# Patient Record
Sex: Female | Born: 1940 | Race: White | Hispanic: No | Marital: Married | Smoking: Former smoker
Health system: Southern US, Community
[De-identification: ages and names within clinical notes are randomized; demographics above are authoritative.]

## PROBLEM LIST (undated history)

## (undated) DIAGNOSIS — M810 Age-related osteoporosis without current pathological fracture: Secondary | ICD-10-CM

## (undated) DIAGNOSIS — G629 Polyneuropathy, unspecified: Secondary | ICD-10-CM

## (undated) DIAGNOSIS — I2510 Atherosclerotic heart disease of native coronary artery without angina pectoris: Secondary | ICD-10-CM

## (undated) DIAGNOSIS — I729 Aneurysm of unspecified site: Secondary | ICD-10-CM

## (undated) DIAGNOSIS — I10 Essential (primary) hypertension: Secondary | ICD-10-CM

## (undated) DIAGNOSIS — K219 Gastro-esophageal reflux disease without esophagitis: Secondary | ICD-10-CM

## (undated) DIAGNOSIS — I351 Nonrheumatic aortic (valve) insufficiency: Secondary | ICD-10-CM

## (undated) DIAGNOSIS — I4891 Unspecified atrial fibrillation: Secondary | ICD-10-CM

## (undated) DIAGNOSIS — I8390 Asymptomatic varicose veins of unspecified lower extremity: Secondary | ICD-10-CM

## (undated) DIAGNOSIS — J449 Chronic obstructive pulmonary disease, unspecified: Secondary | ICD-10-CM

## (undated) DIAGNOSIS — H409 Unspecified glaucoma: Secondary | ICD-10-CM

## (undated) DIAGNOSIS — I700 Atherosclerosis of aorta: Secondary | ICD-10-CM

## (undated) DIAGNOSIS — E785 Hyperlipidemia, unspecified: Secondary | ICD-10-CM

## (undated) HISTORY — DX: Polyneuropathy, unspecified: G62.9

## (undated) HISTORY — PX: CATARACT EXTRACTION: SUR2

## (undated) HISTORY — PX: CARDIAC CATHETERIZATION: SHX172

## (undated) HISTORY — DX: Atherosclerosis of aorta: I70.0

## (undated) HISTORY — PX: FOOT SURGERY: SHX648

## (undated) HISTORY — PX: WISDOM TOOTH EXTRACTION: SHX21

## (undated) HISTORY — DX: Atherosclerotic heart disease of native coronary artery without angina pectoris: I25.10

## (undated) HISTORY — DX: Nonrheumatic aortic (valve) insufficiency: I35.1

## (undated) HISTORY — PX: AORTIC VALVE SURGERY: SHX549

## (undated) HISTORY — DX: Gastro-esophageal reflux disease without esophagitis: K21.9

## (undated) HISTORY — PX: BUNIONECTOMY: SHX129

## (undated) HISTORY — PX: TONSILLECTOMY: SUR1361

## (undated) HISTORY — DX: Asymptomatic varicose veins of unspecified lower extremity: I83.90

---

## 2015-10-10 HISTORY — PX: FEMUR FRACTURE SURGERY: SHX633

## 2019-08-13 ENCOUNTER — Emergency Department (HOSPITAL_BASED_OUTPATIENT_CLINIC_OR_DEPARTMENT_OTHER)
Admission: EM | Admit: 2019-08-13 | Discharge: 2019-08-13 | Disposition: A | Discharge status: Home / Self Care | Payer: Medicare HMO | Attending: Emergency Medicine | Admitting: Emergency Medicine

## 2019-08-13 ENCOUNTER — Other Ambulatory Visit: Payer: Self-pay

## 2019-08-13 ENCOUNTER — Encounter (HOSPITAL_BASED_OUTPATIENT_CLINIC_OR_DEPARTMENT_OTHER): Payer: Self-pay

## 2019-08-13 DIAGNOSIS — J449 Chronic obstructive pulmonary disease, unspecified: Secondary | ICD-10-CM | POA: Insufficient documentation

## 2019-08-13 DIAGNOSIS — I4891 Unspecified atrial fibrillation: Secondary | ICD-10-CM | POA: Insufficient documentation

## 2019-08-13 DIAGNOSIS — I10 Essential (primary) hypertension: Secondary | ICD-10-CM | POA: Diagnosis not present

## 2019-08-13 DIAGNOSIS — I259 Chronic ischemic heart disease, unspecified: Secondary | ICD-10-CM | POA: Insufficient documentation

## 2019-08-13 DIAGNOSIS — R1310 Dysphagia, unspecified: Secondary | ICD-10-CM | POA: Diagnosis not present

## 2019-08-13 HISTORY — DX: Chronic obstructive pulmonary disease, unspecified: J44.9

## 2019-08-13 HISTORY — DX: Atherosclerotic heart disease of native coronary artery without angina pectoris: I25.10

## 2019-08-13 HISTORY — DX: Essential (primary) hypertension: I10

## 2019-08-13 HISTORY — DX: Unspecified glaucoma: H40.9

## 2019-08-13 HISTORY — DX: Hyperlipidemia, unspecified: E78.5

## 2019-08-13 HISTORY — DX: Age-related osteoporosis without current pathological fracture: M81.0

## 2019-08-13 HISTORY — DX: Aneurysm of unspecified site: I72.9

## 2019-08-13 HISTORY — DX: Unspecified atrial fibrillation: I48.91

## 2019-08-13 NOTE — ED Provider Notes (Signed)
[facility] EMERGENCY DEPARTMENT Provider Note   CSN: [id number] Arrival date & time: 08/13/19  2114     History   Chief Complaint Chief Complaint  Patient presents with  . Dysphagia    HPI Victoria Holloway is a 78 y.o. female.     78 yo F with a chief complaint of difficulty swallowing.  Patient was eating a hamburger today and realized that she was having some trouble getting it down.  She was able to swallow it and has been able to drink afterwards.  No longer feels like she is having trouble swallowing.  She had trouble a few days ago.  She thinks is due to a new medicine she is taking for bladder spasms.  She plans on stopping taking this medicine following her doctor in the morning.  She denies any double vision denies any difficulty with speech denies unilateral numbness or weakness denies headache or neck pain.  The history is provided by the patient.  Illness Severity:  Moderate Onset quality:  Gradual Duration:  2 days Timing:  Intermittent Progression:  Resolved Chronicity:  New Associated symptoms: no chest pain, no congestion, no fever, no headaches, no myalgias, no nausea, no rhinorrhea, no shortness of breath, no vomiting and no wheezing     Past Medical History:  Diagnosis Date  . Aneurysm (HCC)   . Atrial fibrillation (HCC)   . COPD (chronic obstructive pulmonary disease) (HCC)   . Coronary artery disease    cath 2011  . Glaucoma   . Hyperlipidemia cad  . Hypertension   . Osteoporosis     There are no active problems to display for this patient.   Past Surgical History:  Procedure Laterality Date  . FEMUR FRACTURE SURGERY  2017     OB History   No obstetric history on file.      Home Medications    Prior to Admission medications   Not on File    Family History No family history on file.  Social History Social History   Tobacco Use  . Smoking status: Never Smoker  . Smokeless tobacco: Never Used  Substance Use Topics   . Alcohol use: Yes  . Drug use: Never     Allergies   Patient has no allergy information on record.   Review of Systems Review of Systems  Constitutional: Negative for chills and fever.  HENT: Negative for congestion and rhinorrhea.   Eyes: Negative for redness and visual disturbance.  Respiratory: Negative for shortness of breath and wheezing.   Cardiovascular: Negative for chest pain and palpitations.  Gastrointestinal: Negative for nausea and vomiting.       Difficulty swallowing  Genitourinary: Negative for dysuria and urgency.  Musculoskeletal: Negative for arthralgias and myalgias.  Skin: Negative for pallor and wound.  Neurological: Negative for dizziness and headaches.     Physical Exam Updated Vital Signs BP (!) 140/50   Pulse 67   Temp 97.8 F (36.6 C) (Oral)   Resp 17   Ht 5\' 5"  (1.651 m)   Wt 63 kg   SpO2 99%   BMI 23.13 kg/m   Physical Exam Vitals signs and nursing note reviewed.  Constitutional:      General: She is not in acute distress.    Appearance: She is well-developed. She is not diaphoretic.     Comments: Appearance of someone with rheumatological disease.  HENT:     Head: Normocephalic and atraumatic.  Eyes:     Pupils: Pupils  are equal, round, and reactive to light.  Neck:     Musculoskeletal: Normal range of motion and neck supple.  Cardiovascular:     Rate and Rhythm: Normal rate and regular rhythm.     Heart sounds: No murmur. No friction rub. No gallop.   Pulmonary:     Effort: Pulmonary effort is normal.     Breath sounds: No wheezing or rales.  Abdominal:     General: There is no distension.     Palpations: Abdomen is soft.     Tenderness: There is no abdominal tenderness.  Musculoskeletal:        General: No tenderness.  Skin:    General: Skin is warm and dry.  Neurological:     Mental Status: She is alert and oriented to person, place, and time.  Psychiatric:        Behavior: Behavior normal.      ED Treatments /  Results  Labs (all labs ordered are listed, but only abnormal results are displayed) Labs Reviewed - No data to display  EKG None  Radiology No results found.  Procedures Procedures (including critical care time)  Medications Ordered in ED Medications - No data to display   Initial Impression / Assessment and Plan / ED Course  I have reviewed the triage vital signs and the nursing notes.  Pertinent labs & imaging results that were available during my care of the patient were reviewed by me and considered in my medical decision making (see chart for details).        78 yo F with a cc of dysphagia.  No other stroke symptoms.  Patient newly on an anticholinergic drug, this could be contributing to her symptoms or she could have a new dysphagia.  We will have her follow-up with her family doctor.  10:45 PM:  I have discussed the diagnosis/risks/treatment options with the patient and believe the pt to be eligible for discharge home to follow-up with PCP. We also discussed returning to the ED immediately if new or worsening sx occur. We discussed the sx which are most concerning (e.g., sudden worsening pain, fever, inability to tolerate by mouth) that necessitate immediate return. Medications administered to the patient during their visit and any new prescriptions provided to the patient are listed below.  Medications given during this visit Medications - No data to display   The patient appears reasonably screen and/or stabilized for discharge and I doubt any other medical condition or other [facility] requiring further screening, evaluation, or treatment in the ED at this time prior to discharge.    Final Clinical Impressions(s) / ED Diagnoses   Final diagnoses:  Dysphagia, unspecified type    ED Discharge Orders    None       Deno Etienne, DO 08/13/19 2245

## 2019-08-13 NOTE — ED Triage Notes (Signed)
Pt reports issues swallowing during dinner this evening- believes it is related to her medication solifenacin.

## 2019-08-13 NOTE — Discharge Instructions (Signed)
Call your doctor and discuss your visit here with [place].  See if they want to stop your medication and have you follow-up with a GI doctor in the office.  Try to eat a soft diet in the meantime.  Return if anything gets stuck or if you are physically unable to swallow.

## 2019-11-23 ENCOUNTER — Ambulatory Visit: Payer: Medicare HMO | Attending: Internal Medicine

## 2019-11-23 DIAGNOSIS — Z23 Encounter for immunization: Secondary | ICD-10-CM | POA: Insufficient documentation

## 2019-11-23 NOTE — Progress Notes (Signed)
   Covid-19 Vaccination Clinic  Name:  Victoria Holloway    MRN: [id number] DOB: [birth year]  11/23/2019  [name] was observed post Covid-19 immunization for 15 minutes without incidence. She was provided with Vaccine Information Sheet and instruction to access the V-Safe system.   [name] was instructed to call 911 with any severe reactions post vaccine: Marland Kitchen Difficulty breathing  . Swelling of your face and throat  . A fast heartbeat  . A bad rash all over your body  . Dizziness and weakness    Immunizations Administered    Name Date Dose VIS Date Route   Pfizer COVID-19 Vaccine 11/23/2019  2:57 PM 0.3 mL 09/19/2019 Intramuscular   Manufacturer: [employer], [employer]   Lot: [id number]   NDC: [address]-0

## 2019-12-16 ENCOUNTER — Ambulatory Visit: Payer: Medicare HMO | Attending: Internal Medicine

## 2019-12-16 DIAGNOSIS — Z23 Encounter for immunization: Secondary | ICD-10-CM | POA: Insufficient documentation

## 2019-12-16 NOTE — Progress Notes (Signed)
   Covid-19 Vaccination Clinic  Name:  Victoria Holloway    MRN: [id number] DOB: 1941/08/22  12/16/2019  [name] was observed post Covid-19 immunization for 15 minutes without incident. She was provided with Vaccine Information Sheet and instruction to access the V-Safe system.   [name] was instructed to call 911 with any severe reactions post vaccine: Marland Kitchen Difficulty breathing  . Swelling of face and throat  . A fast heartbeat  . A bad rash all over body  . Dizziness and weakness   Immunizations Administered    Name Date Dose VIS Date Route   Pfizer COVID-19 Vaccine 12/16/2019  4:46 PM 0.3 mL 09/19/2019 Intramuscular   Manufacturer: [employer], [employer]   Lot: [id number]   NDC: [address]-4   Pfizer COVID-19 Vaccine 12/16/2019  4:45 PM 0.3 mL 09/19/2019 Intramuscular   Manufacturer: [employer], [employer]   Lot: [id number]   NDC: [address]-7

## 2019-12-17 ENCOUNTER — Ambulatory Visit: Payer: Medicare HMO

## 2020-04-16 ENCOUNTER — Emergency Department (HOSPITAL_BASED_OUTPATIENT_CLINIC_OR_DEPARTMENT_OTHER): Payer: Medicare HMO

## 2020-04-16 ENCOUNTER — Emergency Department (HOSPITAL_BASED_OUTPATIENT_CLINIC_OR_DEPARTMENT_OTHER)
Admission: EM | Admit: 2020-04-16 | Discharge: 2020-04-16 | Disposition: A | Discharge status: Home / Self Care | Payer: Medicare HMO | Attending: Emergency Medicine | Admitting: Emergency Medicine

## 2020-04-16 ENCOUNTER — Encounter (HOSPITAL_BASED_OUTPATIENT_CLINIC_OR_DEPARTMENT_OTHER): Payer: Self-pay | Admitting: *Deleted

## 2020-04-16 ENCOUNTER — Other Ambulatory Visit: Payer: Self-pay

## 2020-04-16 DIAGNOSIS — J449 Chronic obstructive pulmonary disease, unspecified: Secondary | ICD-10-CM | POA: Insufficient documentation

## 2020-04-16 DIAGNOSIS — I10 Essential (primary) hypertension: Secondary | ICD-10-CM | POA: Diagnosis not present

## 2020-04-16 DIAGNOSIS — I2510 Atherosclerotic heart disease of native coronary artery without angina pectoris: Secondary | ICD-10-CM | POA: Diagnosis not present

## 2020-04-16 DIAGNOSIS — M7989 Other specified soft tissue disorders: Secondary | ICD-10-CM | POA: Diagnosis not present

## 2020-04-16 NOTE — ED Provider Notes (Signed)
Emergency Department Provider Note   I have reviewed the triage vital signs and the nursing notes.   HISTORY  Chief Complaint Leg Swelling   HPI Victoria Holloway is a 79 y.o. female with PMH reviewed presents to the ED with right leg pain and swelling. She has had bilateral leg swelling for the last several weeks.  She uses compression stockings and has been compliant with this.  She states she has not been as good about elevating her legs.  She feels like the left leg swelling is mostly gone down with the right leg has been more persistently swollen.  She denies pain in the leg.  She is not experiencing chest pain, palpitations, shortness of breath.  She called to speak with her primary care doctor and was referred to the emergency department for further evaluation of the right leg swelling.  She does note a bluish discoloration to her feet especially when standing or hanging and low.  This seems to resolve with raising the legs up.   Past Medical History:  Diagnosis Date  . Aneurysm (HCC)   . Atrial fibrillation (HCC)   . COPD (chronic obstructive pulmonary disease) (HCC)   . Coronary artery disease    cath 2011  . Glaucoma   . Hyperlipidemia cad  . Hypertension   . Osteoporosis     There are no problems to display for this patient.   Past Surgical History:  Procedure Laterality Date  . FEMUR FRACTURE SURGERY  2017    Allergies Patient has no known allergies.  No family history on file.  Social History Social History   Tobacco Use  . Smoking status: Never Smoker  . Smokeless tobacco: Never Used  Substance Use Topics  . Alcohol use: Yes  . Drug use: Never    Review of Systems  Constitutional: No fever/chills Eyes: No visual changes. ENT: No sore throat. Cardiovascular: Denies chest pain. Respiratory: Denies shortness of breath. Gastrointestinal: No abdominal pain.  No nausea, no vomiting.  No diarrhea.  No constipation. Genitourinary: Negative for  dysuria. Musculoskeletal: Negative for back pain. Positive right leg swelling.  Skin: Negative for rash. Neurological: Negative for headaches, focal weakness or numbness.  10-point ROS otherwise negative.  ____________________________________________   PHYSICAL EXAM:  VITAL SIGNS: ED Triage Vitals  Enc Vitals Group     BP 04/16/20 1804 121/78     Pulse Rate 04/16/20 1804 (!) 56     Resp 04/16/20 1804 20     Temp 04/16/20 1804 98.4 F (36.9 C)     Temp Source 04/16/20 1804 Oral     SpO2 04/16/20 1804 98 %     Weight 04/16/20 1758 135 lb 12.8 oz (61.6 kg)     Height 04/16/20 1758 5\' 5"  (1.651 m)   Constitutional: Alert and oriented. Well appearing and in no acute distress. Eyes: Conjunctivae are normal.  Head: Atraumatic. Nose: No congestion/rhinnorhea. Mouth/Throat: Mucous membranes are moist.   Neck: No stridor.   Cardiovascular: Normal rate, regular rhythm. Good peripheral circulation. Grossly normal heart sounds.   Respiratory: Normal respiratory effort.  No retractions. Lungs CTAB. Gastrointestinal: No distention.  Musculoskeletal: Pitting edema bilaterally but worse on the right. No cellulitis of findings concerning compartment syndrome.  Neurologic:  Normal speech and language. Normal sensation in the bilateral LEs. Skin:  Skin is warm, dry and intact. No rash noted.  ____________________________________________  RADIOLOGY  Venous Img Lower Unilateral Right  Result Date: 04/16/2020 CLINICAL DATA:  Leg swelling for  several months EXAM: Right LOWER EXTREMITY VENOUS DOPPLER ULTRASOUND TECHNIQUE: Gray-scale sonography with compression, as well as color and duplex ultrasound, were performed to evaluate the deep venous system(s) from the level of the common femoral vein through the popliteal and proximal calf veins. COMPARISON:  Ultrasound 04/09/2020, CT 03/29/2020 FINDINGS: VENOUS Normal compressibility of the common femoral, superficial femoral, and popliteal veins. The  calf veins are poorly evaluated secondary to extensive edema. Visualized portions of profunda femoral vein and great saphenous vein unremarkable. No filling defects to suggest DVT on grayscale or color Doppler imaging. Doppler waveforms show normal direction of venous flow, normal respiratory plasticity and response to augmentation. Limited views of the contralateral common femoral vein are unremarkable. OTHER Complex fluid collection in the right groin measures 5.4 x 2.8 by 4.8 cm, previously 5.6 x 2.6 x 5 cm. Limitations: none IMPRESSION: 1. Negative for acute DVT from the right groin to the popliteal fossa. Poorly evaluated calf veins secondary to extensive edema 2. Persistent complex collection in the right groin measuring 5.4 cm, this is grossly unchanged in size as compared with recently performed ultrasound 04/09/2020, noted to be not active on PET CT from May. Further management/need for aspiration will need to be based on clinical grounds. Electronically Signed   By: Jasmine Pang M.D.   On: 04/16/2020 19:19    ____________________________________________   PROCEDURES  Procedure(s) performed:   Procedures  None  ____________________________________________   INITIAL IMPRESSION / ASSESSMENT AND PLAN / ED COURSE  Pertinent labs & imaging results that were available during my care of the patient were reviewed by me and considered in my medical decision making (see chart for details).   Patient presents emergency department for evaluation of right leg pain with swelling.  She does have some mild blue discoloration when the feet are hanging off the bedside but when I elevate the feet for my exam color returns to normal.  She has a 2+ DP and PT pulse in the right foot with normal sensation.  No ulcerations or skin breakdown.  DVT ultrasound reviewed with no acute DVT.  Plan for continued compression stockings and foot elevation at home.  Patient to follow closely with her PCP in the coming  week.  No shortness of breath, chest pain, hypoxemia to suspect volume overload or PE.    ____________________________________________  FINAL CLINICAL IMPRESSION(S) / ED DIAGNOSES  Final diagnoses:  Right leg swelling    Note:  This document was prepared using Dragon voice recognition software and may include unintentional dictation errors.  Alona Bene, MD, [facility] Emergency Medicine    Severina Sykora, Arlyss Repress, MD 04/17/20 1520

## 2020-04-16 NOTE — Discharge Instructions (Signed)
You were seen in the emergency department today with right leg swelling.  There is no evidence of blood clot in the leg after your ultrasound today.  Please continue to use your compression hose and keep your legs elevated when you are not walking or standing.  Please follow closely with your primary care doctor and return to the emergency room with any new or suddenly worsening symptoms such as chest pain or shortness of breath.

## 2020-04-16 NOTE — ED Triage Notes (Signed)
Swelling to her right lower leg for months. Her toes have been blue for a week.

## 2020-12-04 IMAGING — US US EXTREM LOW VENOUS*R*
1 series · 13 of 24 positions shown · non-contrast
Comparison: Ultrasound 04/09/2020, CT 03/29/2020

CLINICAL DATA: Leg swelling for several months

EXAM:
Right LOWER EXTREMITY VENOUS DOPPLER ULTRASOUND
TECHNIQUE: Gray-scale sonography with compression, as well as color and duplex
ultrasound, were performed to evaluate the deep venous system(s)
from the level of the common femoral vein through the popliteal and
proximal calf veins.

[Series 1: us extrem low venous*right* · 13 of 56 slices shown]
[im 1/56]
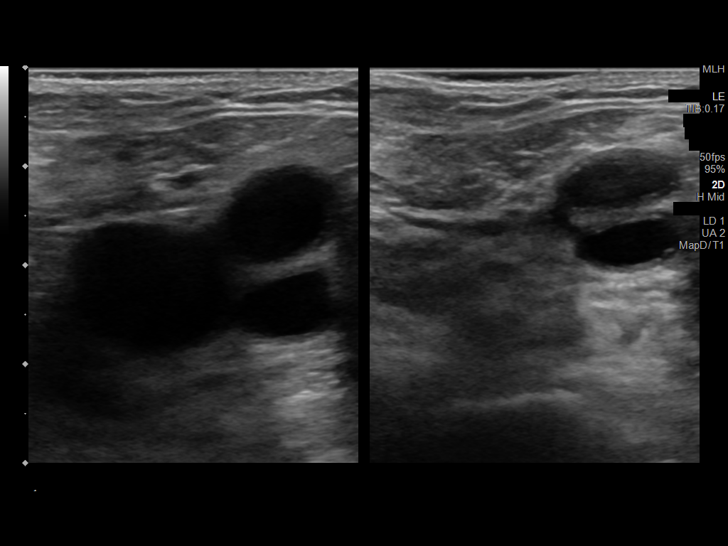
[im 5/56]
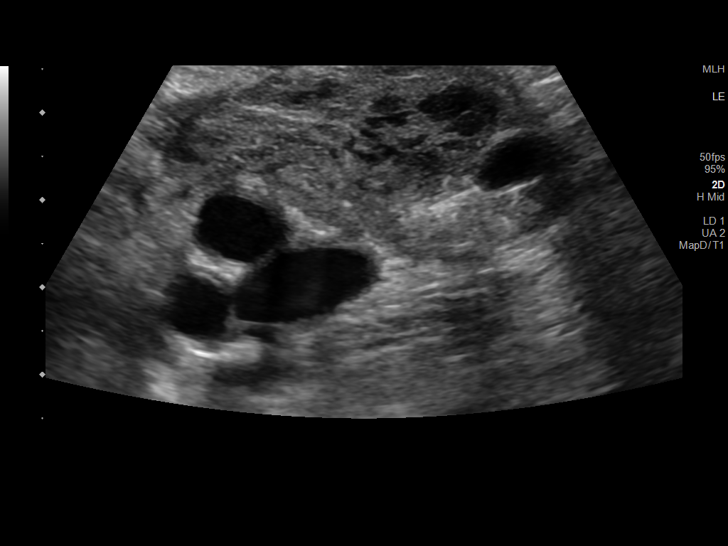
[im 10/56]
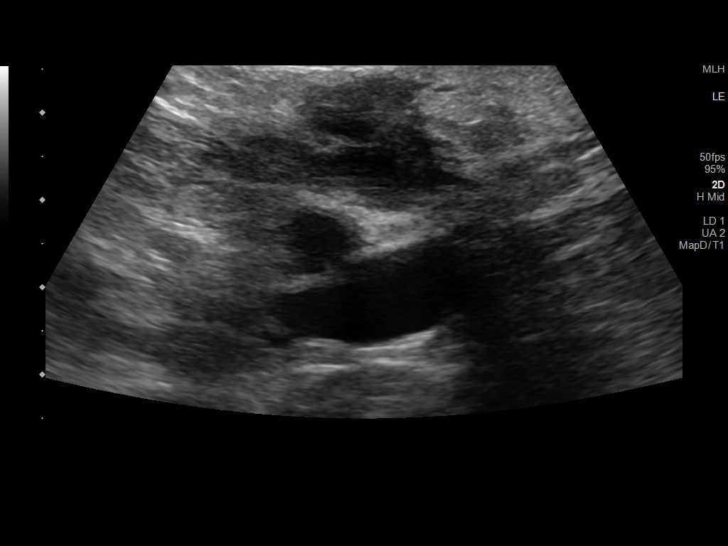
[im 15/56]
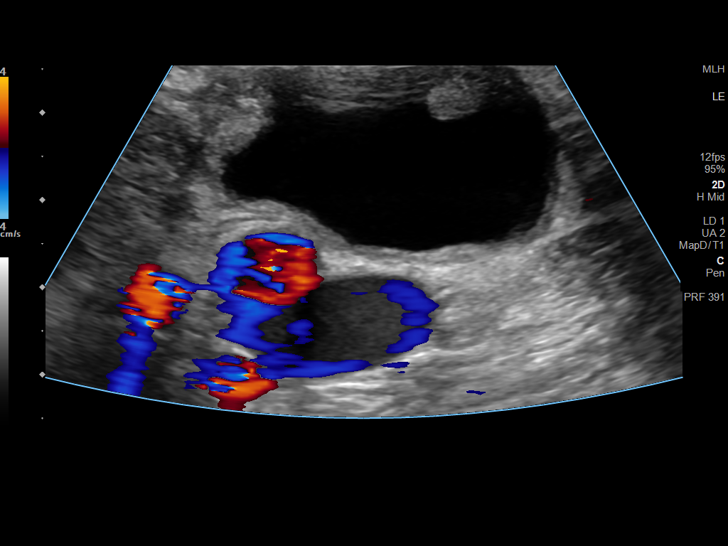
[im 20/56]
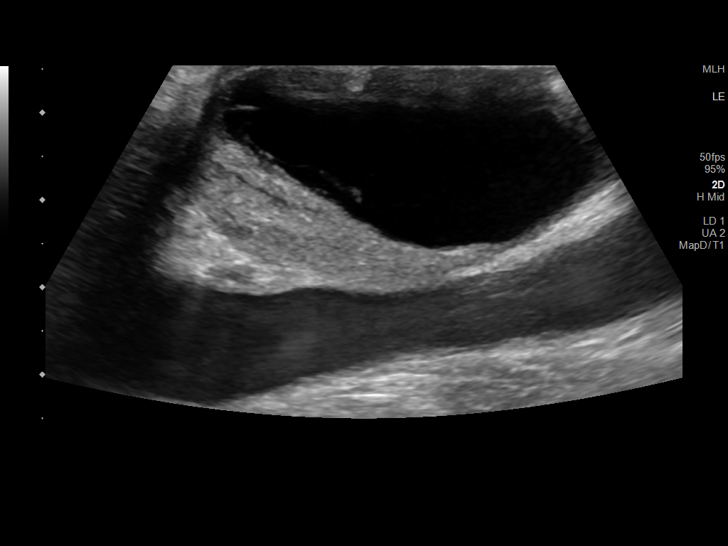
[im 24/56]
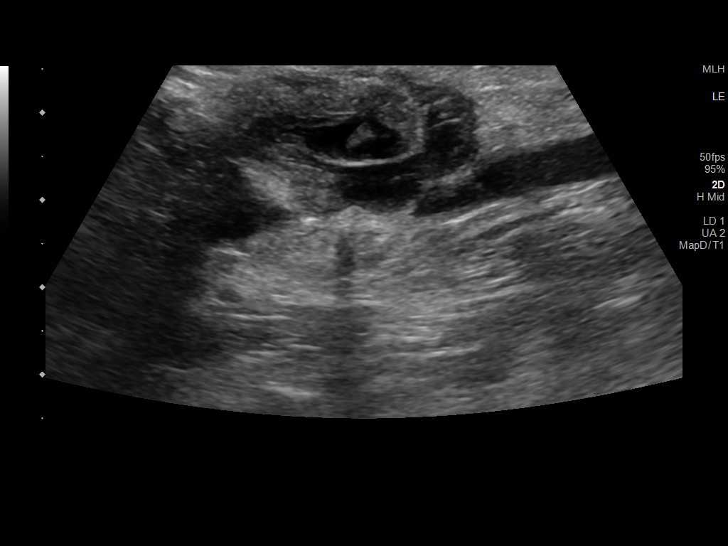
[im 29/56]
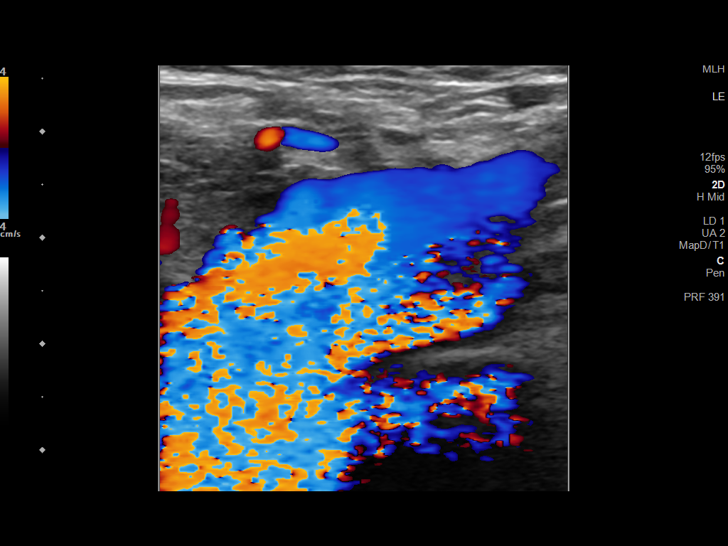
[im 32/56]
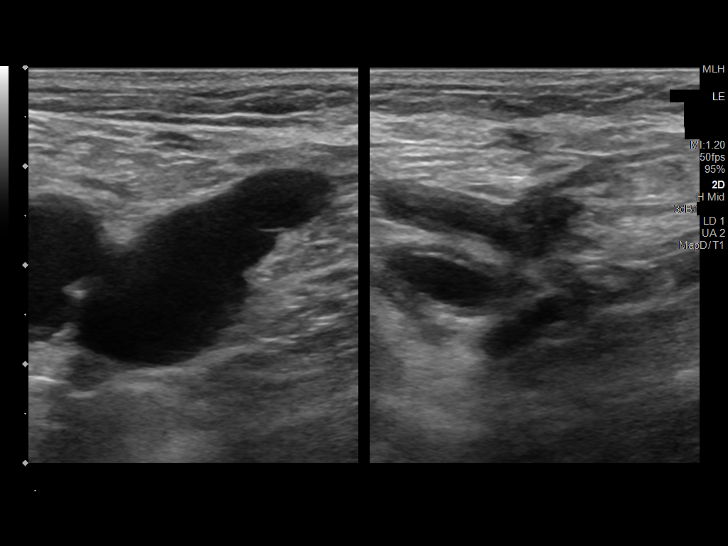
[im 36/56]
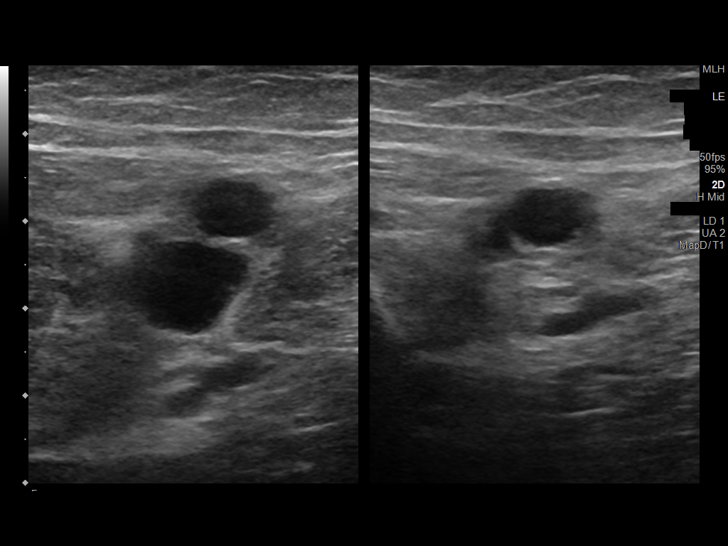
[im 41/56]
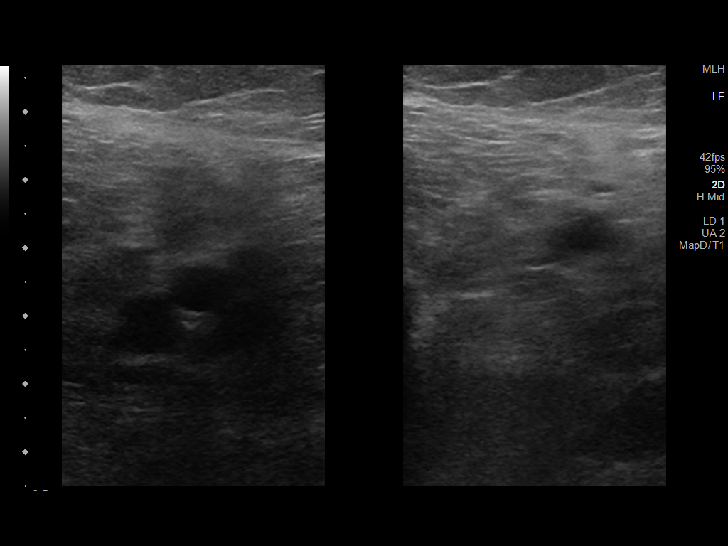
[im 46/56]
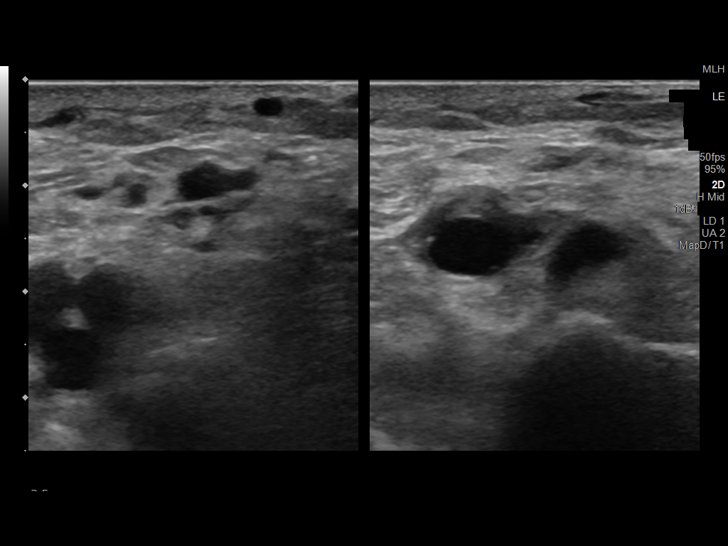
[im 51/56]
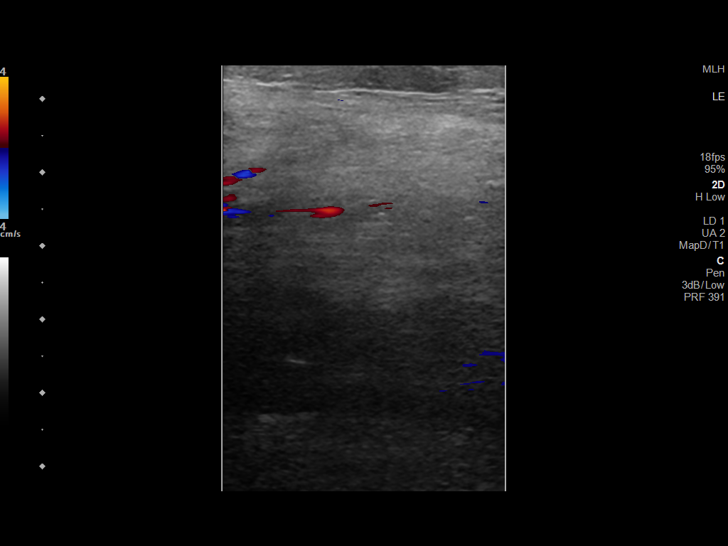
[im 56/56]
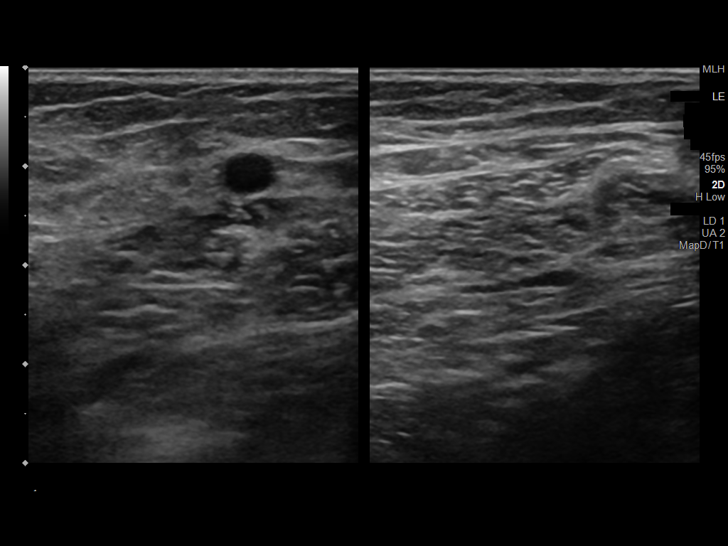

[13 of 24 positions shown; findings below may reference images not displayed]

FINDINGS: VENOUS

Normal compressibility of the common femoral, superficial femoral,
and popliteal veins. The calf veins are poorly evaluated secondary
to extensive edema. Visualized portions of profunda femoral vein and
great saphenous vein unremarkable. No filling defects to suggest DVT
on grayscale or color Doppler imaging. Doppler waveforms show normal
direction of venous flow, normal respiratory plasticity and response
to augmentation.

Limited views of the contralateral common femoral vein are
unremarkable.

OTHER

Complex fluid collection in the right groin measures 5.4 x 2.8 by
4.8 cm, previously 5.6 x 2.6 x 5 cm.

Limitations: none
IMPRESSION: 1. Negative for acute DVT from the right groin to the popliteal
fossa. Poorly evaluated calf veins secondary to extensive edema
2. Persistent complex collection in the right groin measuring
cm, this is grossly unchanged in size as compared with recently
performed ultrasound 04/09/2020, noted to be not active on PET CT
from May. Further management/need for aspiration will need to be
based on clinical grounds.

## 2022-02-07 ENCOUNTER — Encounter: Payer: Self-pay | Admitting: *Deleted

## 2022-02-08 ENCOUNTER — Encounter: Payer: Self-pay | Admitting: Diagnostic Neuroimaging

## 2022-02-08 ENCOUNTER — Ambulatory Visit: Payer: Medicare HMO | Admitting: Diagnostic Neuroimaging

## 2022-02-08 VITALS — BP 104/65 | HR 57 | Ht 62.0 in | Wt 129.0 lb

## 2022-02-08 DIAGNOSIS — G629 Polyneuropathy, unspecified: Secondary | ICD-10-CM | POA: Diagnosis not present

## 2022-02-08 DIAGNOSIS — M48062 Spinal stenosis, lumbar region with neurogenic claudication: Secondary | ICD-10-CM | POA: Diagnosis not present

## 2022-02-08 NOTE — Patient Instructions (Signed)
?  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 ?POLYNEUROPATHY ?- check neuropathy labs ?

## 2022-02-08 NOTE — Progress Notes (Signed)
? ?[facility] ? ?PATIENT: Victoria Holloway ?DOB: 10-Feb-1941 ? ?REFERRING CLINICIAN: Andreas Blower., MD ?HISTORY FROM: patient  ?REASON FOR VISIT: new consult  ? ? ?HISTORICAL ? ?CHIEF COMPLAINT:  ?Chief Complaint  ?Patient presents with  ? Polyneuropathy  ?  Rm 6 New Pt, 2nd opinion husband- Sherlynn Stalls  ? ? ?HISTORY OF PRESENT ILLNESS:  ? ? ?81 year old female here for evaluation of numbness, tingling, back pain. ? ?October 2022 patient had some tingling sensation in her right hand up to digit #5.  Symptoms are mild and intermittent. ? ?For past 3 to 4 years patient is had gait and balance decline, now using a walker.  She has had low back pain for several years but worse since at least March 2023.  Symptoms are worse when she stands or walks for long time. ? ?Patient went to neurologist for evaluation and was diagnosed with generalized sensorimotor polyneuropathy.  Also was diagnosed with lumbar spinal stenosis based on CT abdomen pelvis study.  Patient requested second opinion with [place]. ? ? ? ?REVIEW OF SYSTEMS: Full 14 system review of systems performed and negative with exception of: as per hPI. ? ?ALLERGIES: ?Allergies  ?Allergen Reactions  ? Cat Hair Extract   ?  Sneezing weeping and coughing  ? Mixed Ragweed Itching  ?  Sneezing weeping and coughing  ?  ? ? ?HOME MEDICATIONS: ?Outpatient Medications Prior to Visit  ?Medication Sig Dispense Refill  ? acetaminophen (TYLENOL) 500 MG tablet Take by mouth.    ? aspirin 81 MG EC tablet Take by mouth.    ? azelastine (ASTELIN) 0.1 % nasal spray USE 1 SPRAY(S) IN EACH NOSTRIL TWICE DAILY AS DIRECTED    ? dorzolamide (TRUSOPT) 2 % ophthalmic solution Place 1 drop into both eyes 2 times daily. Use at breakfast and dinner time daily    ? ferrous sulfate 325 (65 FE) MG tablet Take by mouth.    ? latanoprost (XALATAN) 0.005 % ophthalmic solution 1 drop at bedtime.    ? montelukast (SINGULAIR) 10 MG tablet Take 10 mg by mouth at bedtime.    ? rosuvastatin  (CRESTOR) 20 MG tablet Take 1 tablet by mouth daily.    ? solifenacin (VESICARE) 5 MG tablet Take 5 mg by mouth daily.    ? amiodarone (PACERONE) 200 MG tablet Take by mouth.    ? apixaban (ELIQUIS) 5 MG TABS tablet Take by mouth.    ? Fluticasone Furoate (ARNUITY ELLIPTA) 100 MCG/ACT AEPB Inhale into the lungs. (Patient not taking: Reported on 02/08/2022)    ? pantoprazole (PROTONIX) 40 MG tablet Take by mouth.    ? metoprolol tartrate (LOPRESSOR) 25 MG tablet Take by mouth.    ? ?No facility-administered medications prior to visit.  ? ? ?PAST MEDICAL HISTORY: ?Past Medical History:  ?Diagnosis Date  ? Aneurysm (HCC)   ? Aortic valve regurgitation   ? Atrial fibrillation (HCC)   ? CAD (coronary artery disease)   ? COPD (chronic obstructive pulmonary disease) (HCC)   ? Coronary artery disease   ? cath 2011  ? GERD (gastroesophageal reflux disease)   ? Glaucoma   ? Hyperlipidemia cad  ? Hypertension   ? Osteoporosis   ? Polyneuropathy   ? Thoracic aorta atherosclerosis (HCC)   ? Varicose vein of leg   ? ? ?PAST SURGICAL HISTORY: ?Past Surgical History:  ?Procedure Laterality Date  ? AORTIC VALVE SURGERY    ? BUNIONECTOMY    ? CARDIAC CATHETERIZATION    ?  CATARACT EXTRACTION    ? FEMUR FRACTURE SURGERY  2017  ? FOOT SURGERY    ? TONSILLECTOMY    ? WISDOM TOOTH EXTRACTION    ? ? ?FAMILY HISTORY: ?No family history on file. ? ?SOCIAL HISTORY: ?Social History  ? ?Socioeconomic History  ? Marital status: Married  ?  Spouse name: Sherlynn StallsGiuseppe  ? Number of children: Not on file  ? Years of education: Not on file  ? Highest education level: Bachelor's degree (e.g., BA, AB, BS)  ?Occupational History  ? Not on file  ?Tobacco Use  ? Smoking status: Former  ?  Packs/day: 1.00  ?  Years: 10.00  ?  Pack years: 10.00  ?  Types: Cigarettes  ?  Quit date: 11/11/1988  ?  Years since quitting: 33.2  ? Smokeless tobacco: Never  ?Substance and Sexual Activity  ? Alcohol use: Yes  ?  Comment: 02/08/22 1/2 glass wine at dinner  ? Drug use: Never  ?  Sexual activity: Not on file  ?Other Topics Concern  ? Not on file  ?Social History Narrative  ? Lives with husband  ? ?Social Determinants of Health  ? ?Financial Resource Strain: Not on file  ?Food Insecurity: Not on file  ?Transportation Needs: Not on file  ?Physical Activity: Not on file  ?Stress: Not on file  ?Social Connections: Not on file  ?Intimate Partner Violence: Not on file  ? ? ? ?PHYSICAL EXAM ? ? ?GENERAL EXAM/CONSTITUTIONAL: ?Vitals:  ?Vitals:  ? 02/08/22 0937  ?BP: 104/65  ?Pulse: (!) 57  ?Weight: 129 lb (58.5 kg)  ?Height: 5\' 2"  (1.575 m)  ? ?Body mass index is 23.59 kg/m?. ?Wt Readings from Last 3 Encounters:  ?02/08/22 129 lb (58.5 kg)  ?04/16/20 135 lb 12.8 oz (61.6 kg)  ?08/13/19 139 lb (63 kg)  ? ?Patient is in no distress; well developed, nourished and groomed; neck is supple ? ?CARDIOVASCULAR: ?Examination of carotid arteries is normal; no carotid bruits ?Regular rate and rhythm, no murmurs ?Examination of peripheral vascular system by observation and palpation is normal ? ?EYES: ?Ophthalmoscopic exam of optic discs and posterior segments is normal; no papilledema or hemorrhages ?No results found. ? ?MUSCULOSKELETAL: ?Gait, strength, tone, movements noted in Neurologic exam below ? ?NEUROLOGIC: ?MENTAL STATUS:  ?   ? View : No data to display.  ?  ?  ?  ? ?awake, alert, oriented to person, place and time ?recent and remote memory intact ?normal attention and concentration ?language fluent, comprehension intact, naming intact ?fund of knowledge appropriate ? ?CRANIAL NERVE:  ?2nd - no papilledema on fundoscopic exam ?2nd, 3rd, 4th, 6th - pupils equal and reactive to light, visual fields full to confrontation, extraocular muscles intact, no nystagmus ?5th - facial sensation symmetric ?7th - facial strength symmetric ?8th - hearing intact ?9th - palate elevates symmetrically, uvula midline ?11th - shoulder shrug symmetric ?12th - tongue protrusion midline ? ?MOTOR:  ?ARTHRITIS CHANGES IN  FINGERS ?BUE 4+ ?RLE HIP FLEX 2-3; KNEE EXT/FLEX 4+, df 4+ ?LLE HIP FLEX 3; OTHERWISE 5 ? ?SENSORY:  ?normal and symmetric to light touch; DECR IN KNEES, ANKLES ? ?COORDINATION:  ?finger-nose-finger, fine finger movements SLOW ? ?REFLEXES:  ?deep tendon reflexes TRACE and symmetric ? ?GAIT/STATION:  ?ANTALGIC based gait; USING WALKER ? ? ? ? ?DIAGNOSTIC DATA (LABS, IMAGING, TESTING) ?- I reviewed patient records, labs, notes, testing and imaging myself where available. ? ?No results found for: WBC, HGB, HCT, MCV, PLT ?No results found for: NA, K, CL,  CO2, GLUCOSE, BUN, CREATININE, CALCIUM, PROT, ALBUMIN, AST, ALT, ALKPHOS, BILITOT, GFRNONAA, GFRAA ?No results found for: CHOL, HDL, LDLCALC, LDLDIRECT, TRIG, CHOLHDL ?No results found for: HGBA1C ?No results found for: VITAMINB12 ?No results found for: TSH ? ? ?08/22/21 EMG/NCS --> "generalized sensorimotor polyneuropathy mixed in nature" ? ?12/25/21 CT abdomen / pelvis --> "Lumbar spinal stenosis around L3-4 seen on CT scan of the abdomen" ? ? ? ?ASSESSMENT AND PLAN ? ?81 y.o. year old female here with: ? ? ?Dx: ? ?1. Spinal stenosis of lumbar region with neurogenic claudication   ?2. Polyneuropathy   ? ? ? ?PLAN: ? ?LUMBAR SPINAL STENOSIS (L3-4) WITH NEUROGENIC CLAUDICATION; also prior right femur fracture and repair ?- continue PT and supportive care; if surgery is considered (not currently per patient), then may check MRI lumbar spine ?- consider pain mgmt referral ? ?POLYNEUROPATHY ?- check neuropathy labs ? ? ?Orders Placed This Encounter  ?Procedures  ? Vitamin B12  ? A1c  ? TSH  ? SPEP with IFE  ? ANA w/Reflex  ? SSA, SSB  ? ANCA Profile  ? Vitamin B6  ? ?Return for return to PCP, pending test results. ? ?I spent 65 minutes of face-to-face and non-face-to-face time with patient.  This included previsit chart review, lab review, study review, order entry, electronic health record documentation, patient education.   ? ? ?Suanne Marker, MD 02/08/2022, 10:37  AM ?Certified in Neurology, Neurophysiology and Neuroimaging ? ?[facility] ?[address], [address] ?[place], [place] [address] ?[phone] ? ?

## 2022-02-13 ENCOUNTER — Encounter: Payer: Self-pay | Admitting: Diagnostic Neuroimaging

## 2022-02-16 LAB — VITAMIN B12: Vitamin B-12: 815 pg/mL (ref 232–1245)

## 2022-02-16 LAB — TSH: TSH: 6.14 u[IU]/mL — ABNORMAL HIGH (ref 0.450–4.500)

## 2022-02-16 LAB — SJOGREN'S SYNDROME ANTIBODS(SSA + SSB)
ENA SSA (RO) Ab: <0.2 AI (ref 0.0–0.9)
ENA SSB (LA) Ab: <0.2 AI (ref 0.0–0.9)

## 2022-02-16 LAB — MULTIPLE MYELOMA PANEL, SERUM
Albumin SerPl Elph-Mcnc: 3.4 g/dL (ref 2.9–4.4)
Albumin/Glob SerPl: 1.4 (ref 0.7–1.7)
Alpha 1: 0.3 g/dL (ref 0.0–0.4)
Alpha2 Glob SerPl Elph-Mcnc: 0.8 g/dL (ref 0.4–1.0)
B-Globulin SerPl Elph-Mcnc: 0.8 g/dL (ref 0.7–1.3)
Gamma Glob SerPl Elph-Mcnc: 0.6 g/dL (ref 0.4–1.8)
Globulin, Total: 2.5 g/dL (ref 2.2–3.9)
IgA/Immunoglobulin A, Serum: 125 mg/dL (ref 64–422)
IgG (Immunoglobin G), Serum: 623 mg/dL (ref 586–1602)
IgM (Immunoglobulin M), Srm: 70 mg/dL (ref 26–217)
Total Protein: 5.9 g/dL — ABNORMAL LOW (ref 6.0–8.5)

## 2022-02-16 LAB — ANCA PROFILE
Anti-MPO Antibodies: <0.2 units (ref 0.0–0.9)
Anti-PR3 Antibodies: <0.2 units (ref 0.0–0.9)
Atypical pANCA: <1:20 {titer}
C-ANCA: <1:20 {titer}
P-ANCA: <1:20 {titer}

## 2022-02-16 LAB — VITAMIN B6: Vitamin B6: 39.4 ug/L (ref 3.4–65.2)

## 2022-02-16 LAB — HEMOGLOBIN A1C
Est. average glucose Bld gHb Est-mCnc: 123 mg/dL
Hgb A1c MFr Bld: 5.9 % — ABNORMAL HIGH (ref 4.8–5.6)

## 2022-02-16 LAB — ANA W/REFLEX: ANA Titer 1: NEGATIVE

## 2022-02-20 ENCOUNTER — Telehealth: Payer: Self-pay

## 2022-02-20 NOTE — Telephone Encounter (Signed)
I called pt. No answer, left a message asking pt to call me back.   

## 2022-02-20 NOTE — Telephone Encounter (Signed)
-----   Message from Penni Bombard, MD sent at 02/16/2022  9:08 PM EDT ----- ?Thyroid hormone testing is abnormal; needs PCP follow up. Other minor changes in labs are ok. No other cause of neuropathy found.  Symptoms are likely related to neuropathy + lumbar spine dz as discussed at last visit. -VRP ?

## 2022-02-22 NOTE — Telephone Encounter (Signed)
LVM requesting call back for lab results. Faxed TSH lab to PCP for his review. Received confirmation. ?

## 2022-02-23 ENCOUNTER — Encounter: Payer: Self-pay | Admitting: *Deleted

## 2022-02-23 NOTE — Telephone Encounter (Signed)
Mailed lab results and Dr Richrd Humbles result note to patient. Advised she call her PCP re: thyroid abnormal result.

## 2023-08-29 ENCOUNTER — Other Ambulatory Visit: Payer: Self-pay

## 2023-08-29 ENCOUNTER — Emergency Department (HOSPITAL_COMMUNITY)
Admission: EM | Admit: 2023-08-29 | Discharge: 2023-08-30 | Disposition: A | Discharge status: Home / Self Care | Payer: Medicare HMO | Attending: Emergency Medicine | Admitting: Emergency Medicine

## 2023-08-29 ENCOUNTER — Encounter (HOSPITAL_COMMUNITY): Payer: Self-pay

## 2023-08-29 DIAGNOSIS — Z20822 Contact with and (suspected) exposure to covid-19: Secondary | ICD-10-CM | POA: Diagnosis not present

## 2023-08-29 DIAGNOSIS — Z7982 Long term (current) use of aspirin: Secondary | ICD-10-CM | POA: Insufficient documentation

## 2023-08-29 DIAGNOSIS — J449 Chronic obstructive pulmonary disease, unspecified: Secondary | ICD-10-CM | POA: Insufficient documentation

## 2023-08-29 DIAGNOSIS — Z79899 Other long term (current) drug therapy: Secondary | ICD-10-CM | POA: Insufficient documentation

## 2023-08-29 DIAGNOSIS — R059 Cough, unspecified: Secondary | ICD-10-CM | POA: Diagnosis present

## 2023-08-29 DIAGNOSIS — Z7901 Long term (current) use of anticoagulants: Secondary | ICD-10-CM | POA: Insufficient documentation

## 2023-08-29 DIAGNOSIS — J189 Pneumonia, unspecified organism: Secondary | ICD-10-CM | POA: Diagnosis not present

## 2023-08-29 DIAGNOSIS — Z7951 Long term (current) use of inhaled steroids: Secondary | ICD-10-CM | POA: Insufficient documentation

## 2023-08-29 DIAGNOSIS — I10 Essential (primary) hypertension: Secondary | ICD-10-CM | POA: Insufficient documentation

## 2023-08-29 DIAGNOSIS — I2510 Atherosclerotic heart disease of native coronary artery without angina pectoris: Secondary | ICD-10-CM | POA: Insufficient documentation

## 2023-08-29 LAB — BASIC METABOLIC PANEL
Anion gap: 8 (ref 5–15)
BUN: 20 mg/dL (ref 8–23)
CO2: 23 mmol/L (ref 22–32)
Calcium: 9 mg/dL (ref 8.9–10.3)
Chloride: 110 mmol/L (ref 98–111)
Creatinine, Ser: 0.51 mg/dL (ref 0.44–1.00)
GFR, Estimated: >60 mL/min (ref >60)
Glucose, Bld: 95 mg/dL (ref 70–99)
Potassium: 4.1 mmol/L (ref 3.5–5.1)
Sodium: 141 mmol/L (ref 135–145)

## 2023-08-29 LAB — CBC
HCT: 37.2 % (ref 36.0–46.0)
Hemoglobin: 12.1 g/dL (ref 12.0–15.0)
MCH: 31 pg (ref 26.0–34.0)
MCHC: 32.5 g/dL (ref 30.0–36.0)
MCV: 95.4 fL (ref 80.0–100.0)
Platelets: 272 10*3/uL (ref 150–400)
RBC: 3.9 MIL/uL (ref 3.87–5.11)
RDW: 13.2 % (ref 11.5–15.5)
WBC: 9.3 10*3/uL (ref 4.0–10.5)
nRBC: 0 % (ref 0.0–0.2)

## 2023-08-29 LAB — SARS CORONAVIRUS 2 BY RT PCR: SARS Coronavirus 2 by RT PCR: NEGATIVE

## 2023-08-29 NOTE — ED Triage Notes (Signed)
Pt presents via POV c/o upper respiratory infection since 11/13 per pt. Pt reports sent to ED for low saturations. 02 saturation currently 96% on room air. Pt currently in no obvious acute respiratory distress. Breathing unlabored at this time. A&O x4.

## 2023-08-30 ENCOUNTER — Emergency Department (HOSPITAL_COMMUNITY): Payer: Medicare HMO

## 2023-08-30 MED ORDER — AMOXICILLIN-POT CLAVULANATE 875-125 MG PO TABS: 1.0000 | ORAL_TABLET | Freq: Two times a day (BID) | ORAL | 0 refills | Status: AC

## 2023-08-30 MED ORDER — AMOXICILLIN-POT CLAVULANATE 875-125 MG PO TABS
1.0000 | ORAL_TABLET | Freq: Once | ORAL | Status: AC
  Administered 2023-08-30: 1 via ORAL
  Filled 2023-08-30: qty 1

## 2023-08-30 NOTE — ED Provider Notes (Signed)
[place] EMERGENCY DEPARTMENT AT [facility] Provider Note  CSN: [id number] Arrival date & time: 08/29/23 1951  Chief Complaint(s) URI  HPI Victoria Holloway is a 82 y.o. female with a past medical history listed below here for persistent cough for 2 weeks.  Patient reports nasal congestion untreated with over-the-counter medicine.  Seen by urgent care last week and seen again earlier today due to persistant cough. She had a chest x-ray concerning for pneumonia.  Patient was also told that her saturations were low at which point they recommended she present to the emergency department for evaluation.  Patient denies any fevers or chills.  Cough is productive.  No nausea or vomiting.  Patient was prescribed cough medicine on initial UC visit.  The history is provided by the patient.    Past Medical History Past Medical History:  Diagnosis Date   Aneurysm (HCC)    Aortic valve regurgitation    Atrial fibrillation (HCC)    CAD (coronary artery disease)    COPD (chronic obstructive pulmonary disease) (HCC)    Coronary artery disease    cath 2011   GERD (gastroesophageal reflux disease)    Glaucoma    Hyperlipidemia cad   Hypertension    Osteoporosis    Polyneuropathy    Thoracic aorta atherosclerosis (HCC)    Varicose vein of leg    There are no problems to display for this patient.  Home Medication(s) Prior to Admission medications   Medication Sig Start Date End Date Taking? Authorizing Provider  amoxicillin-clavulanate (AUGMENTIN) 875-125 MG tablet Take 1 tablet by mouth every 12 (twelve) hours. 08/30/23  Yes Reannon Candella, Amadeo Garnet, MD  acetaminophen (TYLENOL) 500 MG tablet Take by mouth.    [provider]  amiodarone (PACERONE) 200 MG tablet Take by mouth. 04/14/20 02/08/22  [provider]  apixaban (ELIQUIS) 5 MG TABS tablet Take by mouth. 04/14/20 07/13/20  [provider]  aspirin 81 MG EC tablet Take by mouth. 11/27/16   [provider]  azelastine (ASTELIN) 0.1 % nasal spray USE 1 SPRAY(S) IN EACH NOSTRIL TWICE DAILY AS DIRECTED 03/03/20   [provider]  dorzolamide (TRUSOPT) 2 % ophthalmic solution Place 1 drop into both eyes 2 times daily. Use at breakfast and dinner time daily 02/18/20   [provider]  ferrous sulfate 325 (65 FE) MG tablet Take by mouth. 03/18/20   [provider]  Fluticasone Furoate (ARNUITY ELLIPTA) 100 MCG/ACT AEPB Inhale into the lungs. Patient not taking: Reported on 02/08/2022    [provider]  latanoprost (XALATAN) 0.005 % ophthalmic solution 1 drop at bedtime. 03/19/20   [provider]  montelukast (SINGULAIR) 10 MG tablet Take 10 mg by mouth at bedtime. 11/11/19   [provider]  pantoprazole (PROTONIX) 40 MG tablet Take by mouth. 02/07/20 04/27/20  [provider]  rosuvastatin (CRESTOR) 20 MG tablet Take 1 tablet by mouth daily. 02/16/20   [provider]  solifenacin (VESICARE) 5 MG tablet Take 5 mg by mouth daily. 02/03/20   [provider]  Allergies Cat hair extract and Mixed ragweed  Review of Systems Review of Systems As noted in HPI  Physical Exam Vital Signs  I have reviewed the triage vital signs BP 127/69 (BP Location: Right Arm)   Pulse 76   Temp 99.5 F (37.5 C) (Oral)   Resp 18   Ht 5\' 1"  (1.549 m)   Wt 66.2 kg   SpO2 97%   BMI 27.59 kg/m   Physical Exam Vitals reviewed.  Constitutional:      General: She is not in acute distress.    Appearance: She is well-developed. She is not diaphoretic.  HENT:     Head: Normocephalic and atraumatic.     Nose: Nose normal.  Eyes:     General: No scleral icterus.       Right eye: No discharge.        Left eye: No discharge.     Conjunctiva/sclera: Conjunctivae normal.     Pupils: Pupils are equal, round,  and reactive to light.  Cardiovascular:     Rate and Rhythm: Normal rate and regular rhythm.     Heart sounds: No murmur heard.    No friction rub. No gallop.  Pulmonary:     Effort: Pulmonary effort is normal. No respiratory distress.     Breath sounds: No stridor. Examination of the left-lower field reveals rales. Rales present.  Abdominal:     General: There is no distension.     Palpations: Abdomen is soft.     Tenderness: There is no abdominal tenderness.  Musculoskeletal:        General: No tenderness.     Cervical back: Normal range of motion and neck supple.     Comments: kyphotic  Skin:    General: Skin is warm and dry.     Findings: No erythema or rash.  Neurological:     Mental Status: She is alert and oriented to person, place, and time.     ED Results and Treatments Labs (all labs ordered are listed, but only abnormal results are displayed) Labs Reviewed  SARS CORONAVIRUS 2 BY RT PCR  CBC  BASIC METABOLIC PANEL                                                                                                                         EKG  EKG Interpretation Date/Time:    Ventricular Rate:    PR Interval:    QRS Duration:    QT Interval:    QTC Calculation:   R Axis:      Text Interpretation:         Radiology DG Chest 2 View  Result Date: 08/30/2023 CLINICAL DATA:  Cough. EXAM: CHEST - 2 VIEW COMPARISON:  08/29/2023. FINDINGS: The heart size and mediastinal contours are within normal limits. A prosthetic cardiac valve is noted. There is atherosclerotic calcification of the aorta. A moderate hiatal hernia is present. There is elevation of the left diaphragm with atelectasis, scarring, or  infiltrate at the left lung base. No effusion or pneumothorax. Hyperinflation of the lungs is noted. There are degenerative changes in the thoracic spine. No acute osseous abnormality. IMPRESSION: 1. Mild atelectasis, scarring, or infiltrate at the left lung base. 2. Moderate  hiatal hernia. Electronically Signed   By: Thornell Sartorius M.D.   On: 08/30/2023 03:15    Medications Ordered in ED Medications  amoxicillin-clavulanate (AUGMENTIN) 875-125 MG per tablet 1 tablet (has no administration in time range)   Procedures Procedures  (including critical care time) Medical Decision Making / ED Course   Medical Decision Making Amount and/or Complexity of Data Reviewed Radiology: ordered.  Risk Prescription drug management.    Patient with persistent cough following a recent upper respiratory infection Viral panel negative for COVID. CBC without leukocytosis or anemia Metabolic panel without significant electrolyte derangements or renal sufficiency Chest x-ray suspicious for left lower lobe pneumonia.  Will treat with Augmentin.     Final Clinical Impression(s) / ED Diagnoses Final diagnoses:  Community acquired pneumonia of left lower lobe of lung   The patient appears reasonably screened and/or stabilized for discharge and I doubt any other medical condition or other [facility] requiring further screening, evaluation, or treatment in the ED at this time. I have discussed the findings, Dx and Tx plan with the patient/family who expressed understanding and agree(s) with the plan. Discharge instructions discussed at length. The patient/family was given strict return precautions who verbalized understanding of the instructions. No further questions at time of discharge.  Disposition: Discharge  Condition: Good  ED Discharge Orders          Ordered    amoxicillin-clavulanate (AUGMENTIN) 875-125 MG tablet  Every 12 hours        08/30/23 0436              Follow Up: Andreas Blower., MD [address] [place] [address]  Call  to schedule an appointment for close follow up    This chart was dictated using voice recognition software.  Despite best efforts to proofread,  errors can occur which can change the documentation  meaning.    Nira Conn, MD 08/30/23 [phone]

## 2023-08-30 NOTE — ED Notes (Signed)
Misty Stanley from xray notified this tech that the patient refused Xray and would like to speak to the doctor about it.

## 2023-08-30 NOTE — ED Notes (Signed)
Patient transported to X-ray
